# Patient Record
Sex: Male | Born: 1990 | Race: Black or African American | Hispanic: No | Marital: Married | Smoking: Current every day smoker
Health system: Southern US, Community
[De-identification: ages and names within clinical notes are randomized; demographics above are authoritative.]

## PROBLEM LIST (undated history)

## (undated) HISTORY — PX: OTHER SURGICAL HISTORY: SHX169

---

## 2016-08-05 ENCOUNTER — Encounter (HOSPITAL_COMMUNITY): Payer: Self-pay

## 2016-08-05 ENCOUNTER — Emergency Department (HOSPITAL_COMMUNITY): Payer: PRIVATE HEALTH INSURANCE

## 2016-08-05 ENCOUNTER — Emergency Department (HOSPITAL_COMMUNITY)
Admission: EM | Admit: 2016-08-05 | Discharge: 2016-08-05 | Disposition: A | Discharge status: Home / Self Care | Payer: PRIVATE HEALTH INSURANCE | Attending: Emergency Medicine | Admitting: Emergency Medicine

## 2016-08-05 DIAGNOSIS — Y92233 Cafeteria of hospital as the place of occurrence of the external cause: Secondary | ICD-10-CM | POA: Diagnosis not present

## 2016-08-05 DIAGNOSIS — F17200 Nicotine dependence, unspecified, uncomplicated: Secondary | ICD-10-CM | POA: Diagnosis not present

## 2016-08-05 DIAGNOSIS — S0103XA Puncture wound without foreign body of scalp, initial encounter: Secondary | ICD-10-CM | POA: Diagnosis not present

## 2016-08-05 DIAGNOSIS — Y9389 Activity, other specified: Secondary | ICD-10-CM | POA: Diagnosis not present

## 2016-08-05 DIAGNOSIS — S0990XA Unspecified injury of head, initial encounter: Secondary | ICD-10-CM

## 2016-08-05 DIAGNOSIS — W228XXA Striking against or struck by other objects, initial encounter: Secondary | ICD-10-CM | POA: Insufficient documentation

## 2016-08-05 DIAGNOSIS — Y990 Civilian activity done for income or pay: Secondary | ICD-10-CM | POA: Diagnosis not present

## 2016-08-05 DIAGNOSIS — S0101XA Laceration without foreign body of scalp, initial encounter: Secondary | ICD-10-CM

## 2016-08-05 NOTE — ED Provider Notes (Signed)
[facility]-EMERGENCY DEPT Provider Note   CSN: [id number] Arrival date & time: 08/05/16  4098     History   Chief Complaint Chief Complaint  Patient presents with  . Head Injury    HPI Corey Franco is a 25 y.o. male.  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 Patient struck his head on the cafeteria signed corner. About 20 minutes later he noticed that it was bleeding and when he saw the blood he got lightheaded he has some mild discomfort to the head at the site of the wound but no significant headache and patient did not pass out. Patient felt that maybe he would. Patient feels fine now. No neck pain. No other injuries. Tetanus is up-to-date.      History reviewed. No pertinent past medical history.  There are no active problems to display for this patient.   Past Surgical History:  Procedure Laterality Date  . thigh surgery         Home Medications    Prior to Admission medications   Not on File    Family History No family history on file.  Social History Social History  Substance Use Topics  . Smoking status: Current Every Day Smoker  . Smokeless tobacco: Never Used  . Alcohol use No     Allergies   Review of patient's allergies indicates no known allergies.   Review of Systems Review of Systems  Constitutional: Negative for fever.  HENT: Negative for congestion.   Eyes: Negative for visual disturbance.  Respiratory: Negative for shortness of breath.   Cardiovascular: Negative for chest pain.  Gastrointestinal: Negative for abdominal pain.  Musculoskeletal: Negative for neck pain.  Neurological: Positive for light-headedness and headaches.  Psychiatric/Behavioral: Negative for confusion.     Physical Exam Updated Vital Signs BP 155/87 (BP Location: Right Arm)   Pulse 68   Temp 97.5 F (36.4 C) (Oral)   Resp 18   Ht 6\' 6"  (1.981 m)   Wt 99.8 kg   SpO2 98%   BMI 25.42 kg/m   Physical Exam  Constitutional: He  is oriented to person, place, and time. He appears well-developed and well-nourished. No distress.  HENT:  Head: Normocephalic.  Mouth/Throat: Oropharynx is clear and moist.  A top of scalp with a small puncture wound measuring less than 5 mm. No active bleeding. No step off.  Eyes: Conjunctivae and EOM are normal. Pupils are equal, round, and reactive to light.  Neck: Normal range of motion. Neck supple.  Cardiovascular: Normal rate and regular rhythm.   Pulmonary/Chest: Effort normal. No respiratory distress.  Abdominal: Soft. Bowel sounds are normal. There is no tenderness.  Musculoskeletal: Normal range of motion. He exhibits no edema.  Neurological: He is alert and oriented to person, place, and time. No cranial nerve deficit. He exhibits normal muscle tone. Coordination normal.  Skin: Skin is warm.  Nursing note and vitals reviewed.    ED Treatments / Results  Labs (all labs ordered are listed, but only abnormal results are displayed) Labs Reviewed - No data to display  EKG  EKG Interpretation None       Radiology Ct Head Wo Contrast  Result Date: 08/05/2016 CLINICAL DATA:  Pain and dizziness after hitting head against solid object EXAM: CT HEAD WITHOUT CONTRAST TECHNIQUE: Contiguous axial images were obtained from the base of the skull through the vertex without intravenous contrast. COMPARISON:  None. FINDINGS: Brain: The ventricles are normal in size and configuration. There is  no intracranial mass, hemorrhage, extra-axial fluid collection, or midline shift. Gray-white compartments are normal. No acute infarct evident. Vascular: No hyperdense vessel.  No vascular calcification evident. Skull: The bony calvarium appears intact. Sinuses/Orbits: Visualized paranasal sinuses are clear. Visualized orbits appear symmetric bilaterally. Other: Visualized mastoid air cells are clear. IMPRESSION: Study within normal limits. Electronically Signed   By: Bretta BangWilliam  Woodruff III M.D.   On:  08/05/2016 10:39    Procedures Procedures (including critical care time)  Medications Ordered in ED Medications - No data to display   Initial Impression / Assessment and Plan / ED Course  I have reviewed the triage vital signs and the nursing notes.  Pertinent labs & imaging results that were available during my care of the patient were reviewed by me and considered in my medical decision making (see chart for details).  Clinical Course   Patient status post head injury in the cafeteria well at work. Resulting in a puncture wound to the scalp with slight bleeding. No suture stapling repair required. Tetanus up-to-date. Patient just with pain at around the wound site was started to bleed felt lightheaded. Head CT negative basic wound care work note provided for today. Patient will return for any new or worse symptoms. Did discuss the possibility of mild concussive symptoms with the patient.  Final Clinical Impressions(s) / ED Diagnoses   Final diagnoses:  Head injury, initial encounter  Scalp laceration, initial encounter    New Prescriptions New Prescriptions   No medications on file     Vanetta MuldersScott Lucianna Ostlund, MD 08/05/16 1122

## 2016-08-05 NOTE — Discharge Instructions (Signed)
CT of the head was negative for any bony or brain injury. Work note provided. Return for any new or worse symptoms.

## 2016-08-05 NOTE — ED Notes (Signed)
Bed: WA02 Expected date:  Expected time:  Means of arrival:  Comments: Employee/head injury

## 2016-08-05 NOTE — ED Triage Notes (Signed)
Pt presents with c/o head injury. Pt was at work to pick up a patient in [employer] and as he turned around to pick up his food, being 6'6", he hit his head on the cafeteria sign. Pt reports he is feeling some dizziness at this time. Pt reports no LOC after the incident. Alert and oriented at this time and able to answer questions appropriately.

## 2017-08-18 ENCOUNTER — Inpatient Hospital Stay: Payer: PRIVATE HEALTH INSURANCE

## 2017-08-30 ENCOUNTER — Ambulatory Visit: Payer: Self-pay | Attending: Internal Medicine | Admitting: Physician Assistant

## 2017-08-30 ENCOUNTER — Encounter: Payer: Self-pay | Admitting: Physician Assistant

## 2017-09-06 ENCOUNTER — Ambulatory Visit: Payer: Self-pay | Attending: Nurse Practitioner | Admitting: Nurse Practitioner

## 2017-09-06 ENCOUNTER — Encounter: Payer: Self-pay | Admitting: Nurse Practitioner

## 2017-09-06 VITALS — BP 125/74 | HR 78 | Temp 97.4°F | Ht 78.0 in | Wt 202.4 lb

## 2017-09-06 DIAGNOSIS — F17210 Nicotine dependence, cigarettes, uncomplicated: Secondary | ICD-10-CM | POA: Insufficient documentation

## 2017-09-06 DIAGNOSIS — Z716 Tobacco abuse counseling: Secondary | ICD-10-CM | POA: Insufficient documentation

## 2017-09-06 DIAGNOSIS — L989 Disorder of the skin and subcutaneous tissue, unspecified: Secondary | ICD-10-CM | POA: Insufficient documentation

## 2017-09-06 DIAGNOSIS — Z833 Family history of diabetes mellitus: Secondary | ICD-10-CM | POA: Insufficient documentation

## 2017-09-06 NOTE — Progress Notes (Signed)
Pt is here today for bump on finger and feet.

## 2017-09-06 NOTE — Progress Notes (Signed)
CC:  HPI: Corey Franco is a 26 y.o. male here today to establish care.  He denies any PMH however is concerned about a possible lesion on his right index finger.   Warts Patient complains of a possible wart. The wart is located on the 2nd finger(s) right hand. It has been present for several weeks. He denies pain or cellulitic infection symptoms.  Smoking Cessation Endorses being a smoker for the past 10 years. The longest time he has refrained from cigarettes was 6 days. He is aware of the complications from smoking including stroke and cancer. States "I need to stop".      HEALTH MAINTENANCE Influenza: Patient declines. Reports he has already received the vaccination through his employer.  Tdap:  Patient declines. Reports he has already received the vaccination through his employer.    ALLERGIES: No Known Allergies  PAST MEDICAL HISTORY: History reviewed. No pertinent past medical history.  SOCIAL HISTORY Social History   Social History  . Marital status: Married    Spouse name: N/A  . Number of children: N/A  . Years of education: N/A   Occupational History  . Not on file.   Social History Main Topics  . Smoking status: Current Every Day Smoker  . Smokeless tobacco: Never Used  . Alcohol use No  . Drug use: No  . Sexual activity: Not on file   Other Topics Concern  . Not on file   Social History Narrative  . No narrative on file    FAMILY HISTORY History reviewed. No pertinent family history.   MEDICATIONS AT HOME: Prior to Admission medications   Not on File     Review of Systems  Constitutional: Negative for fever, malaise/fatigue and weight loss.  HENT: Negative.   Eyes: Negative.   Respiratory: Negative.  Negative for cough, shortness of breath and wheezing.   Cardiovascular: Negative.  Negative for chest pain, palpitations and PND.  Gastrointestinal: Positive for constipation. Negative for abdominal pain, diarrhea, heartburn, melena,  nausea and vomiting.  Genitourinary: Negative.   Musculoskeletal: Negative.   Skin: Negative for itching and rash.       Wart on right index finger  Neurological: Negative.  Negative for dizziness, tingling, tremors, seizures and headaches.  Psychiatric/Behavioral: Negative.     Objective:   Vitals:   09/06/17 0946  BP: 125/74  Pulse: 78  Temp: (!) 97.4 F (36.3 C)  SpO2: 100%    Physical Exam  Constitutional: He is oriented to person, place, and time and well-developed, well-nourished, and in no distress.  HENT:  Head: Normocephalic and atraumatic.  Eyes: EOM are normal.  Neck: Normal range of motion.  Cardiovascular: Normal rate, regular rhythm and normal heart sounds.   Pulmonary/Chest: Effort normal and breath sounds normal. No respiratory distress. He has no wheezes. He has no rales. He exhibits no tenderness.  Abdominal: Soft. Bowel sounds are normal. He exhibits no distension and no mass. There is no tenderness. There is no rebound and no guarding.  Musculoskeletal: Normal range of motion.       Right hand: He exhibits normal range of motion, no laceration and no swelling.       Hands: Neurological: He is alert and oriented to person, place, and time. Gait normal.  Skin: Skin is warm and dry.  Psychiatric: Mood, memory, affect and judgment normal.        Assessment and plan:     Corey Franco was seen today for establish care.  Diagnoses and all  orders for this visit:  Finger lesion -     Ambulatory referral to Dermatology  Family history of diabetes mellitus -     Lipid panel -     CBC -     Basic Metabolic Panel  Encounter for smoking cessation counseling Corey Franco was counseled on the dangers of tobacco use, and was advised to quit. Reviewed strategies to maximize success, including removing cigarettes and smoking materials from environment, stress management and support of family/friends as well as pharmacological alternatives including: Wellbutrin, Chantix,  Nicotine patch, Nicotine gum or lozenges. Smoking cessation support: smoking cessation hotline: 1-800-QUIT-NOW.  Smoking cessation classes are also available through [facility]. Call [phone] or visit our website at [email or web address].   Spent 5 minutes counseling on smoking cessation and patient is not ready to quit.  Patient has been counseled extensively about nutrition and exercise. Other issues discussed during this visit include: low cholesterol diet, weight control with exercise at least 150 minutes per week, foot care, annual eye examinations at Ophthalmology, importance of adherence with medications and regular follow-up.    Return in about 4 weeks (around 10/04/2017), for Follow back up for physical and labs in 6-8weeks . Needs to see Diane for financial assistance ,   The patient was given clear instructions to go to ER or return to medical center if symptoms don't improve, worsen or new problems develop. The patient verbalized understanding.    Claiborne RiggZelda W Yazir Koerber, FNP-BC, [facility] [place], [place] [phone]

## 2017-09-06 NOTE — Patient Instructions (Addendum)
Preventive Care 28-39 Years, Male Preventive care refers to lifestyle choices and visits with your health care provider that can promote health and wellness. What does preventive care include?  A yearly physical exam. This is also called an annual well check.  Dental exams once or twice a year.  Routine eye exams. Ask your health care provider how often you should have your eyes checked.  Personal lifestyle choices, including: ? Daily care of your teeth and gums. ? Regular physical activity. ? Eating a healthy diet. ? Avoiding tobacco and drug use. ? Limiting alcohol use. ? Practicing safe sex. What happens during an annual well check? The services and screenings done by your health care provider during your annual well check will depend on your age, overall health, lifestyle risk factors, and family history of disease. Counseling Your health care provider may ask you questions about your:  Alcohol use.  Tobacco use.  Drug use.  Emotional well-being.  Home and relationship well-being.  Sexual activity.  Eating habits.  Work and work environment.  Screening You may have the following tests or measurements:  Height, weight, and BMI.  Blood pressure.  Lipid and cholesterol levels. These may be checked every 5 years starting at age 20.  Diabetes screening. This is done by checking your blood sugar (glucose) after you have not eaten for a while (fasting).  Skin check.  Hepatitis C blood test.  Hepatitis B blood test.  Sexually transmitted disease (STD) testing.  Discuss your test results, treatment options, and if necessary, the need for more tests with your health care provider. Vaccines Your health care provider may recommend certain vaccines, such as:  Influenza vaccine. This is recommended every year.  Tetanus, diphtheria, and acellular pertussis (Tdap, Td) vaccine. You may need a Td booster every 10 years.  Varicella vaccine. You may need this if you  have not been vaccinated.  HPV vaccine. If you are 26 or younger, you may need three doses over 6 months.  Measles, mumps, and rubella (MMR) vaccine. You may need at least one dose of MMR.You may also need a second dose.  Pneumococcal 13-valent conjugate (PCV13) vaccine. You may need this if you have certain conditions and have not been vaccinated.  Pneumococcal polysaccharide (PPSV23) vaccine. You may need one or two doses if you smoke cigarettes or if you have certain conditions.  Meningococcal vaccine. One dose is recommended if you are age 28-21 years and a first-year college student living in a residence hall, or if you have one of several medical conditions. You may also need additional booster doses.  Hepatitis A vaccine. You may need this if you have certain conditions or if you travel or work in places where you may be exposed to hepatitis A.  Hepatitis B vaccine. You may need this if you have certain conditions or if you travel or work in places where you may be exposed to hepatitis B.  Haemophilus influenzae type b (Hib) vaccine. You may need this if you have certain risk factors.  Talk to your health care provider about which screenings and vaccines you need and how often you need them. This information is not intended to replace advice given to you by your health care provider. Make sure you discuss any questions you have with your health care provider. Document Released: 12/20/2001 Document Revised: 07/13/2016 Document Reviewed: 08/25/2015 Elsevier Interactive Patient Education  2017 Elsevier Inc.  

## 2017-09-07 LAB — BASIC METABOLIC PANEL
BUN / CREAT RATIO: 16 (ref 9–20)
BUN: 11 mg/dL (ref 6–20)
CHLORIDE: 101 mmol/L (ref 96–106)
CO2: 27 mmol/L (ref 20–29)
Calcium: 9.6 mg/dL (ref 8.7–10.2)
Creatinine, Ser: 0.7 mg/dL — ABNORMAL LOW (ref 0.76–1.27)
GFR calc Af Amer: 148 mL/min/{1.73_m2} (ref >59)
GFR calc non Af Amer: 128 mL/min/{1.73_m2} (ref >59)
GLUCOSE: 96 mg/dL (ref 65–99)
Potassium: 4.4 mmol/L (ref 3.5–5.2)
SODIUM: 143 mmol/L (ref 134–144)

## 2017-09-07 LAB — LIPID PANEL
CHOL/HDL RATIO: 4.1 ratio (ref 0.0–5.0)
Cholesterol, Total: 160 mg/dL (ref 100–199)
HDL: 39 mg/dL — ABNORMAL LOW (ref >39)
LDL CALC: 98 mg/dL (ref 0–99)
Triglycerides: 116 mg/dL (ref 0–149)
VLDL CHOLESTEROL CAL: 23 mg/dL (ref 5–40)

## 2017-09-07 LAB — CBC
Hematocrit: 46.4 % (ref 37.5–51.0)
Hemoglobin: 15.6 g/dL (ref 13.0–17.7)
MCH: 29.8 pg (ref 26.6–33.0)
MCHC: 33.6 g/dL (ref 31.5–35.7)
MCV: 89 fL (ref 79–97)
PLATELETS: 269 10*3/uL (ref 150–379)
RBC: 5.23 x10E6/uL (ref 4.14–5.80)
RDW: 12.7 % (ref 12.3–15.4)
WBC: 7.3 10*3/uL (ref 3.4–10.8)

## 2017-09-11 ENCOUNTER — Ambulatory Visit: Payer: Self-pay | Attending: Nurse Practitioner

## 2017-10-25 ENCOUNTER — Encounter: Payer: Self-pay | Admitting: Nurse Practitioner

## 2018-03-06 ENCOUNTER — Emergency Department (HOSPITAL_BASED_OUTPATIENT_CLINIC_OR_DEPARTMENT_OTHER)
Admission: EM | Admit: 2018-03-06 | Discharge: 2018-03-06 | Disposition: A | Discharge status: Home / Self Care | Payer: Self-pay | Attending: Emergency Medicine | Admitting: Emergency Medicine

## 2018-03-06 ENCOUNTER — Encounter (HOSPITAL_BASED_OUTPATIENT_CLINIC_OR_DEPARTMENT_OTHER): Payer: Self-pay

## 2018-03-06 ENCOUNTER — Other Ambulatory Visit: Payer: Self-pay

## 2018-03-06 DIAGNOSIS — H6121 Impacted cerumen, right ear: Secondary | ICD-10-CM | POA: Insufficient documentation

## 2018-03-06 DIAGNOSIS — F17210 Nicotine dependence, cigarettes, uncomplicated: Secondary | ICD-10-CM | POA: Insufficient documentation

## 2018-03-06 MED ORDER — IBUPROFEN 800 MG PO TABS
800.0000 mg | ORAL_TABLET | Freq: Once | ORAL | Status: AC
  Administered 2018-03-06: 800 mg via ORAL
  Filled 2018-03-06: qty 1

## 2018-03-06 MED ORDER — DOCUSATE SODIUM 50 MG/5ML PO LIQD
50.0000 mg | Freq: Once | ORAL | Status: AC
  Administered 2018-03-06: 50 mg via OTIC

## 2018-03-06 MED ORDER — NEOMYCIN-POLYMYXIN-HC 3.5-10000-1 OT SUSP: 3.0000 [drp] | Freq: Four times a day (QID) | OTIC | 0 refills | Status: AC

## 2018-03-06 MED ORDER — DOCUSATE SODIUM 50 MG/5ML PO LIQD
ORAL | Status: AC
  Administered 2018-03-06: 50 mg via OTIC
  Filled 2018-03-06: qty 10

## 2018-03-06 MED ORDER — DOCUSATE SODIUM 100 MG PO CAPS
ORAL_CAPSULE | ORAL | Status: AC
  Filled 2018-03-06: qty 1

## 2018-03-06 NOTE — ED Notes (Signed)
Right irrigated with warm water by EMT, Gaspar Garbe. Moderated amount of was removed, wax still visualized against ear drum per MD.

## 2018-03-06 NOTE — ED Triage Notes (Signed)
C/o hearing a static sound and numbness right side of face when he woke at 1am-denies injury-NAD-steady gait

## 2018-03-06 NOTE — ED Provider Notes (Addendum)
[facility] EMERGENCY DEPARTMENT Provider Note   CSN: [id number] Arrival date & time: 03/06/18  0026     History   Chief Complaint Chief Complaint  Patient presents with  . Hearing Problem    HPI Corey Franco is a 27 y.o. male.  The history is provided by the patient.  Otalgia  This is a new problem. The current episode started yesterday. There is pain in the right ear. The problem occurs constantly. The problem has been rapidly worsening. There has been no fever. The pain is moderate. Associated symptoms include hearing loss. Pertinent negatives include no headaches and no neck pain. Associated symptoms comments: Funny sound in right ear. His past medical history does not include tympanostomy tube.    History reviewed. No pertinent past medical history.  There are no active problems to display for this patient.   Past Surgical History:  Procedure Laterality Date  . thigh surgery          Home Medications    Prior to Admission medications   Medication Sig Start Date End Date Taking? Authorizing Provider  neomycin-polymyxin-hydrocortisone (CORTISPORIN) 3.5-10000-1 OTIC suspension Place 3 drops into the right ear 4 (four) times daily. X 7 days 03/06/18   Cy Blamer, MD    Family History No family history on file.  Social History Social History   Tobacco Use  . Smoking status: Current Every Day Smoker    Types: Cigarettes  . Smokeless tobacco: Never Used  Substance Use Topics  . Alcohol use: Yes    Comment: occ  . Drug use: No     Allergies   Patient has no known allergies.   Review of Systems Review of Systems  HENT: Positive for ear pain and hearing loss.   Respiratory: Negative for shortness of breath.   Cardiovascular: Negative for chest pain.  Musculoskeletal: Negative for neck pain.  Neurological: Negative for dizziness, tremors, seizures, syncope, facial asymmetry, speech difficulty, weakness, light-headedness, numbness and  headaches.  All other systems reviewed and are negative.    Physical Exam Updated Vital Signs BP (!) 148/104 (BP Location: Left Arm)   Pulse 78   Temp 98.2 F (36.8 C) (Oral)   Resp 18   Ht  (1.981 m)   Wt 94.9 kg (209 lb 3.5 oz)   SpO2 100%   BMI 24.18 kg/m   Physical Exam  Constitutional: He is oriented to person, place, and time. He appears well-developed and well-nourished.  HENT:  Head: Normocephalic and atraumatic.  Left Ear: No mastoid tenderness. Tympanic membrane is not injected, not perforated, not erythematous and not bulging.  No middle ear effusion. No hemotympanum.  Mouth/Throat: No oropharyngeal exudate.  Initially entire canal on right is full of inspisated cerumen  Post dissimpaction- TM R is scarred and canal is pink and inflamed.   Eyes: EOM are normal.  Neck: Normal range of motion. Neck supple.  Cardiovascular: Normal rate, regular rhythm, normal heart sounds and intact distal pulses.  Pulmonary/Chest: Effort normal and breath sounds normal. No stridor.  Abdominal: Soft. Bowel sounds are normal. There is no tenderness.  Musculoskeletal: Normal range of motion.  Neurological: He is alert and oriented to person, place, and time. He displays normal reflexes.  Skin: Skin is warm and dry. Capillary refill takes less than 2 seconds.     ED Treatments / Results    Procedures Procedures (including critical care time)  Medications Ordered in ED Medications  docusate sodium (COLACE) 100 MG capsule (has no  administration in time range)  ibuprofen (ADVIL,MOTRIN) tablet 800 mg (800 mg Oral Given 03/06/18 0311)  docusate (COLACE) 50 MG/5ML liquid 50 mg (50 mg Right EAR Given 03/06/18 0220)    Irrigation of the right ear done by Gaspar Garbe, tech     Final Clinical Impressions(s) / ED Diagnoses   Final diagnoses:  Impacted cerumen of right ear   Do not put anything in the ear unless directed by a doctor.  Follow up with ENT for ongoing care.    Return  for weakness, numbness, changes in vision or speech, fevers >100.4 unrelieved by medication, shortness of breath, intractable vomiting, or diarrhea, abdominal pain, Inability to tolerate liquids or food, cough, altered mental status or any concerns. No signs of systemic illness or infection. The patient is nontoxic-appearing on exam and vital signs are within normal limits.   I have reviewed the triage vital signs and the nursing notes. Pertinent labs &imaging results that were available during my care of the patient were reviewed by me and considered in my medical decision making (see chart for details).  After history, exam, and medical workup I feel the patient has been appropriately medically screened and is safe for discharge home. Pertinent diagnoses were discussed with the patient. Patient was given return precautions. ED Discharge Orders        Ordered    neomycin-polymyxin-hydrocortisone (CORTISPORIN) 3.5-10000-1 OTIC suspension  4 times daily     03/06/18 0309       Jalayah Gutridge, MD 03/06/18 0403    Sonjia Wilcoxson, MD 03/06/18 1610

## 2018-03-12 ENCOUNTER — Emergency Department (HOSPITAL_COMMUNITY): Payer: Self-pay

## 2018-03-12 ENCOUNTER — Encounter (HOSPITAL_COMMUNITY): Payer: Self-pay | Admitting: *Deleted

## 2018-03-12 ENCOUNTER — Emergency Department (HOSPITAL_COMMUNITY)
Admission: EM | Admit: 2018-03-12 | Discharge: 2018-03-12 | Disposition: A | Discharge status: Home / Self Care | Payer: Self-pay | Attending: Emergency Medicine | Admitting: Emergency Medicine

## 2018-03-12 DIAGNOSIS — S022XXA Fracture of nasal bones, initial encounter for closed fracture: Secondary | ICD-10-CM | POA: Insufficient documentation

## 2018-03-12 DIAGNOSIS — F17210 Nicotine dependence, cigarettes, uncomplicated: Secondary | ICD-10-CM | POA: Insufficient documentation

## 2018-03-12 DIAGNOSIS — Y92009 Unspecified place in unspecified non-institutional (private) residence as the place of occurrence of the external cause: Secondary | ICD-10-CM | POA: Insufficient documentation

## 2018-03-12 DIAGNOSIS — Y9301 Activity, walking, marching and hiking: Secondary | ICD-10-CM | POA: Insufficient documentation

## 2018-03-12 DIAGNOSIS — S022XXB Fracture of nasal bones, initial encounter for open fracture: Secondary | ICD-10-CM

## 2018-03-12 DIAGNOSIS — Z79899 Other long term (current) drug therapy: Secondary | ICD-10-CM | POA: Insufficient documentation

## 2018-03-12 DIAGNOSIS — Y999 Unspecified external cause status: Secondary | ICD-10-CM | POA: Insufficient documentation

## 2018-03-12 DIAGNOSIS — W0110XA Fall on same level from slipping, tripping and stumbling with subsequent striking against unspecified object, initial encounter: Secondary | ICD-10-CM | POA: Insufficient documentation

## 2018-03-12 MED ORDER — HYDROCODONE-ACETAMINOPHEN 5-325 MG PO TABS: 1.0000 | ORAL_TABLET | Freq: Four times a day (QID) | ORAL | 0 refills | Status: AC | PRN

## 2018-03-12 MED ORDER — CEPHALEXIN 500 MG PO CAPS: 500.0000 mg | ORAL_CAPSULE | Freq: Three times a day (TID) | ORAL | 0 refills | Status: AC

## 2018-03-12 MED ORDER — CEPHALEXIN 500 MG PO CAPS: 500.0000 mg | ORAL_CAPSULE | Freq: Three times a day (TID) | ORAL | 0 refills | Status: DC

## 2018-03-12 MED ORDER — HYDROCODONE-ACETAMINOPHEN 5-325 MG PO TABS: 1.0000 | ORAL_TABLET | Freq: Four times a day (QID) | ORAL | 0 refills | Status: DC | PRN

## 2018-03-12 MED ORDER — CEPHALEXIN 500 MG PO CAPS
500.0000 mg | ORAL_CAPSULE | Freq: Once | ORAL | Status: AC
  Administered 2018-03-12: 500 mg via ORAL
  Filled 2018-03-12: qty 1

## 2018-03-12 MED ORDER — BACITRACIN ZINC 500 UNIT/GM EX OINT
TOPICAL_OINTMENT | Freq: Once | CUTANEOUS | Status: AC
  Administered 2018-03-12: 1 via TOPICAL
  Filled 2018-03-12: qty 0.9

## 2018-03-12 NOTE — ED Provider Notes (Signed)
[facility]-EMERGENCY DEPT Provider Note   CSN: [id number] Arrival date & time: 03/12/18  4098     History   Chief Complaint Chief Complaint  Patient presents with  . Fall  . Facial Pain    HPI Corey Franco is a 27 y.o. male.  HPI  27 year old male with no significant past medical history here with fall and facial pain.  The patient states he was taking out his trash at around 1 AM yesterday.  He fell forward after tripping on something on the ground.  He was wearing flip-flops.  He states he struck his face on the trashcan.  No loss of consciousness.  He experienced immediate onset of a nosebleed and nasal pain.  He also had some superficial abrasions on his lips.  He states he went back to bed.  His bleeding was controlled within 10 minutes.  He presents today because he has nasal congestion and can feel like his nose is broken.  Denies any vision changes.  Denies any dental pain or loose teeth.  He has had no further bleeding.  Denies any headache.  No neck pain.  He is not on blood thinners.  Pain is worse with palpation.  No alleviating factors.  History reviewed. No pertinent past medical history.  There are no active problems to display for this patient.   Past Surgical History:  Procedure Laterality Date  . thigh surgery          Home Medications    Prior to Admission medications   Medication Sig Start Date End Date Taking? Authorizing Provider  ibuprofen (ADVIL,MOTRIN) 800 MG tablet Take 800 mg by mouth daily as needed for moderate pain.   Yes [provider]  Multiple Vitamin (MULTIVITAMIN WITH MINERALS) TABS tablet Take 1 tablet by mouth daily.   Yes [provider]  neomycin-polymyxin-hydrocortisone (CORTISPORIN) 3.5-10000-1 OTIC suspension Place 3 drops into the right ear 4 (four) times daily. X 7 days 03/06/18  Yes Palumbo, April, MD  cephALEXin (KEFLEX) 500 MG capsule Take 1 capsule (500 mg total) by mouth 3 (three)  times daily for 7 days. 03/12/18 03/19/18  Shaune Pollack, MD  HYDROcodone-acetaminophen (NORCO/VICODIN) 5-325 MG tablet Take 1-2 tablets by mouth every 6 (six) hours as needed for severe pain. 03/12/18   Shaune Pollack, MD    Family History No family history on file.  Social History Social History   Tobacco Use  . Smoking status: Current Every Day Smoker    Types: Cigarettes  . Smokeless tobacco: Never Used  Substance Use Topics  . Alcohol use: Yes    Comment: occ  . Drug use: No     Allergies   Patient has no known allergies.   Review of Systems Review of Systems  Constitutional: Negative for chills, fatigue and fever.  HENT: Positive for congestion, facial swelling and nosebleeds. Negative for rhinorrhea.   Eyes: Negative for visual disturbance.  Respiratory: Negative for cough, shortness of breath and wheezing.   Cardiovascular: Negative for chest pain and leg swelling.  Gastrointestinal: Negative for abdominal pain, diarrhea, nausea and vomiting.  Genitourinary: Negative for dysuria and flank pain.  Musculoskeletal: Negative for neck pain and neck stiffness.  Skin: Negative for rash and wound.  Allergic/Immunologic: Negative for immunocompromised state.  Neurological: Negative for syncope, weakness and headaches.  All other systems reviewed and are negative.    Physical Exam Updated Vital Signs BP 136/90   Pulse 64   Temp 98 F (36.7 C) (Oral)  Resp 16   Ht  (1.981 m)   Wt 94.8 kg (209 lb)   SpO2 100%   BMI 24.15 kg/m   Physical Exam  Constitutional: He is oriented to person, place, and time. He appears well-developed and well-nourished. No distress.  HENT:  Head: Normocephalic.  Superficial abrasions to the left maxillary cheek.  Superficial, less than 1 mm lacerations on the lip mucosa of the lower and upper lip, with no gaping wounds.  No apparent dental trauma or blood in the oropharynx.  Nasal exam shows palpable crepitance along the right upper  nasal bridge.  There is no nasal septal hematoma or deformity.  Nasopharynx is widely patent.  Phonation normal.  Eyes: Conjunctivae are normal.  Neck: Neck supple.  Cardiovascular: Normal rate, regular rhythm and normal heart sounds. Exam reveals no friction rub.  No murmur heard. Pulmonary/Chest: Effort normal and breath sounds normal. No respiratory distress. He has no wheezes. He has no rales.  Abdominal: He exhibits no distension.  Musculoskeletal: He exhibits no edema.  Neurological: He is alert and oriented to person, place, and time. He exhibits normal muscle tone.  Skin: Skin is warm. Capillary refill takes less than 2 seconds.  Psychiatric: He has a normal mood and affect.  Nursing note and vitals reviewed.    ED Treatments / Results  Labs (all labs ordered are listed, but only abnormal results are displayed) Labs Reviewed - No data to display  EKG None  Radiology Ct Maxillofacial Wo Contrast  Result Date: 03/12/2018 CLINICAL DATA:  Fall at 1 a.m.  Laceration to the nose.  Pain. EXAM: CT MAXILLOFACIAL WITHOUT CONTRAST TECHNIQUE: Multidetector CT imaging of the maxillofacial structures was performed. Multiplanar CT image reconstructions were also generated. COMPARISON:  CT head without contrast 08/05/2016. FINDINGS: Osseous: Bilateral nasal bone fractures are present. There is involvement of the frontal process of the maxilla on the left. There is slight deviation to the right. Osseous nasal septal fracture is noted as well. The sinuses are intact. There are no fractures through the sinuses. Zygomatic arch is normal bilaterally. The mandible is intact and located. Orbits: Globes and orbits are within normal limits. Sinuses: Paranasal sinuses are clear. There is no fluid suggest occult fracture. Soft tissues: Subcutaneous gas spreads over the right maxilla without associated fracture. This is likely related to the nasal bone fractures. Soft tissue swelling is present about the nose.  Additional soft tissue swelling is present bilaterally. Perioral and lip swelling is present on the left. No underlying fracture or foreign body is present. Limited intracranial: Within normal limits. IMPRESSION: 1. Comminuted nasal bone fractures bilaterally are slightly displaced to the right. 2. Anterior nasal septal fracture present. 3. No direct sinus fracture. 4. Diffuse soft tissue swelling over the face as described. Electronically Signed   By: Marin Roberts M.D.   On: 03/12/2018 08:28    Procedures Procedures (including critical care time)  Medications Ordered in ED Medications  bacitracin ointment (1 application Topical Given 03/12/18 0810)  cephALEXin (KEFLEX) capsule 500 mg (500 mg Oral Given 03/12/18 0919)     Initial Impression / Assessment and Plan / ED Course  I have reviewed the triage vital signs and the nursing notes.  Pertinent labs & imaging results that were available during my care of the patient were reviewed by me and considered in my medical decision making (see chart for details).    27 year old male here with nasal pain after fall and facial injury.  No loss of consciousness.  He is not on blood thinners.  On exam, I suspect nasal bridge fracture given crepitance.  No apparent visual changes, or signs of entrapment or significant orbital injury.  Given his crepitance on exam, will obtain CT face.  He denies any headache, loss of consciousness, and would like to prevent additional radiation.  I do not feel CT head indicated given absence of any other red flag symptoms.  No evidence of basilar skull fracture.  No hemotympanum.  CT shows comminuted nasal fracture.  No hematoma.  He appears midline clinically.  Discussed with Dr. Jearld Fenton.  Will place on prophylactic antibiotics and follow-up in clinic in 5 days.  No nose blowing or swimming.  Return precautions given.  Final Clinical Impressions(s) / ED Diagnoses   Final diagnoses:  Open fracture of nasal bone,  initial encounter    ED Discharge Orders        Ordered    cephALEXin (KEFLEX) 500 MG capsule  3 times daily     03/12/18 0941    HYDROcodone-acetaminophen (NORCO/VICODIN) 5-325 MG tablet  Every 6 hours PRN     03/12/18 0941       Shaune Pollack, MD 03/12/18 0945

## 2018-03-12 NOTE — Discharge Instructions (Addendum)
For your nose/nasal fracture: - Do not BLOW your nose - Do not place anything into your nose, such as tissues - Do NOT swim or submerge your head under water - Eat soft foods - Take the antibiotics and pain meds as prescribed  Return to ER if you have uncontrolled bleeding, headaches, vision changes, difficulty breathing.

## 2018-03-12 NOTE — ED Triage Notes (Signed)
Pt stated "got up to take out the trash, tripped and hit face on dumpster, then hit the ground."  Pt presents with lacs to lower lip, abrasion to left cheek & nose.  Pt stated "my nose was bleeding & it feels broken."

## 2018-03-12 NOTE — ED Notes (Addendum)
Pt also presents with lac to left upper lip.  Pt denies LOC.

## 2019-10-03 IMAGING — CT CT MAXILLOFACIAL W/O CM
3 series · 15 of 47 positions shown, 18 images · non-contrast
Comparison: CT head without contrast 08/05/2016.

CLINICAL DATA: Fall at 1 a.m..  Laceration to the nose.  Pain.

EXAM:
CT MAXILLOFACIAL WITHOUT CONTRAST
TECHNIQUE: Multidetector CT imaging of the maxillofacial structures was
performed. Multiplanar CT image reconstructions were also generated.

[Series 3: max soft · axial · 0.33mm/px · z∈[-227,-69]mm · 9 of 93 slices shown, 12 images]
[im 7/93  brain]
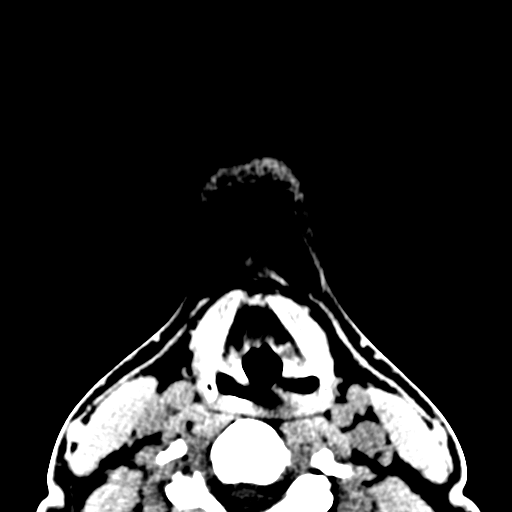
[im 7/93  bone]
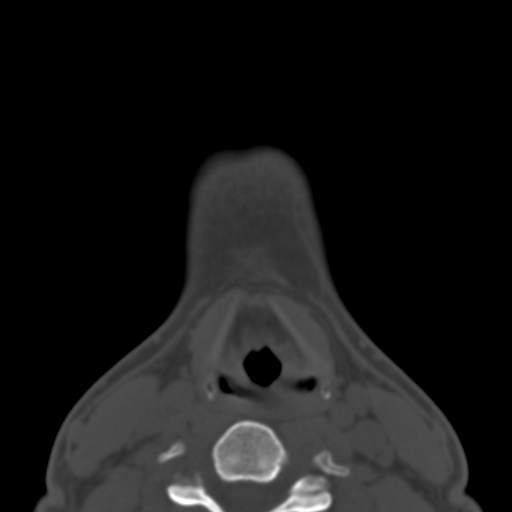
[im 16/93  bone]
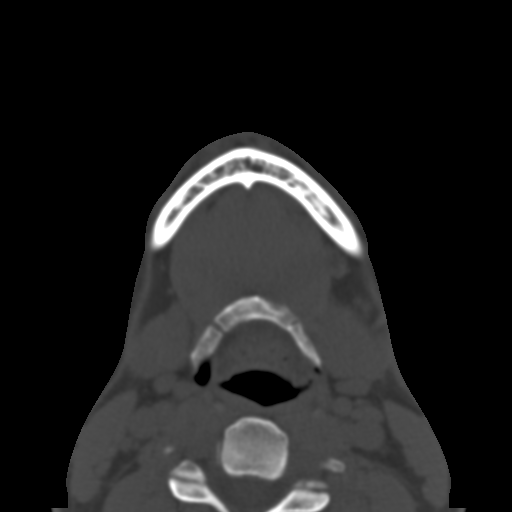
[im 26/93  bone]
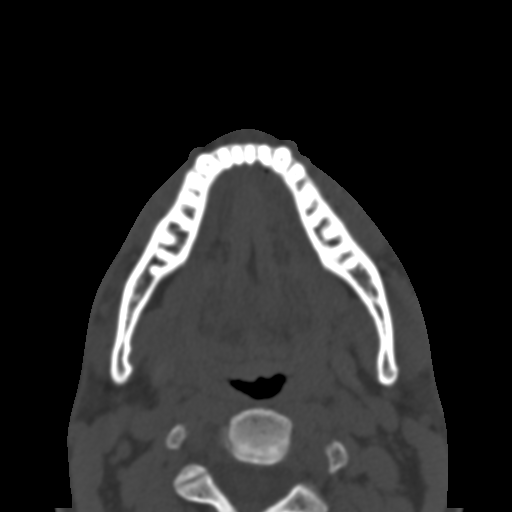
[im 35/93  bone]
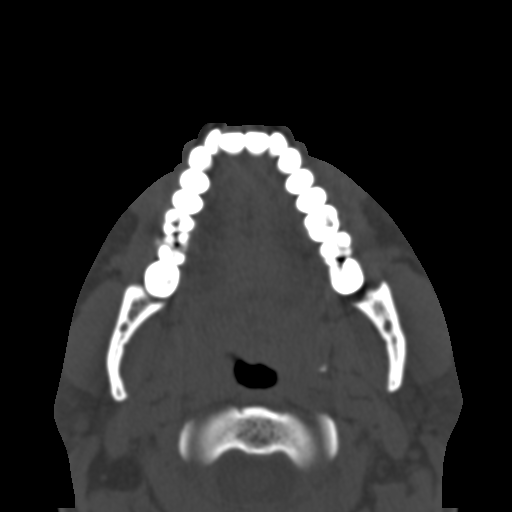
[im 48/93  brain]
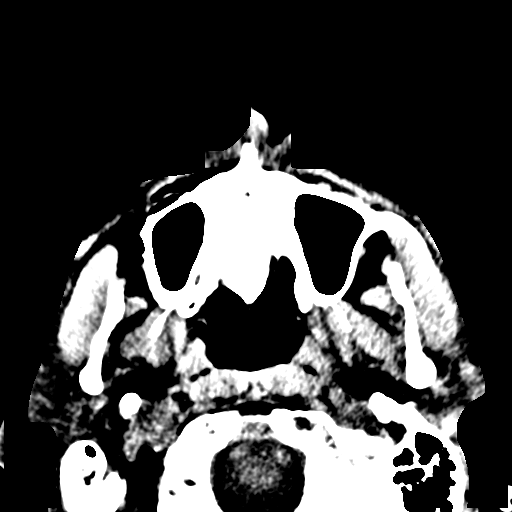
[im 48/93  bone]
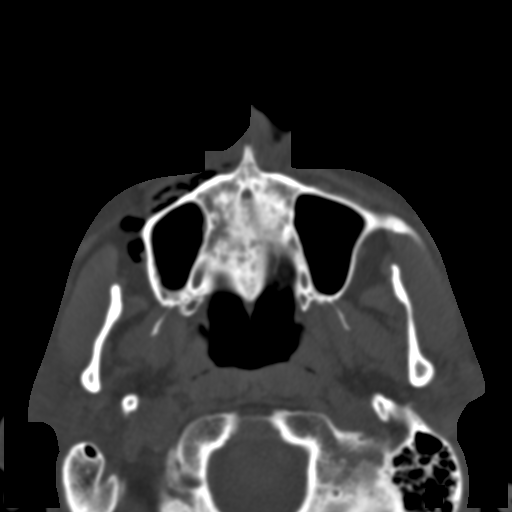
[im 58/93  bone]
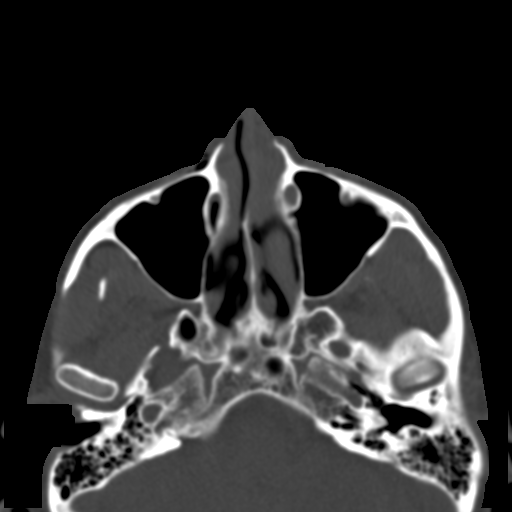
[im 67/93  bone]
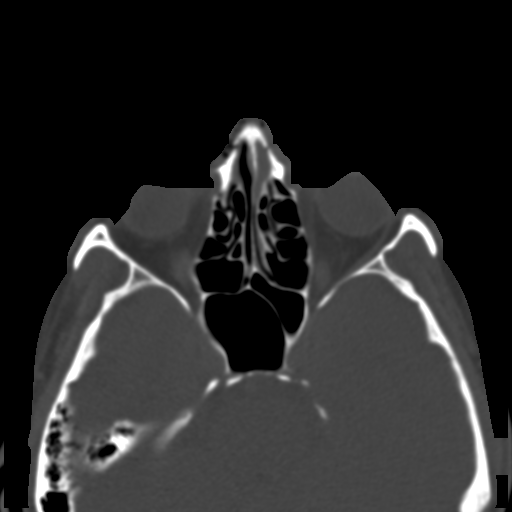
[im 77/93  bone]
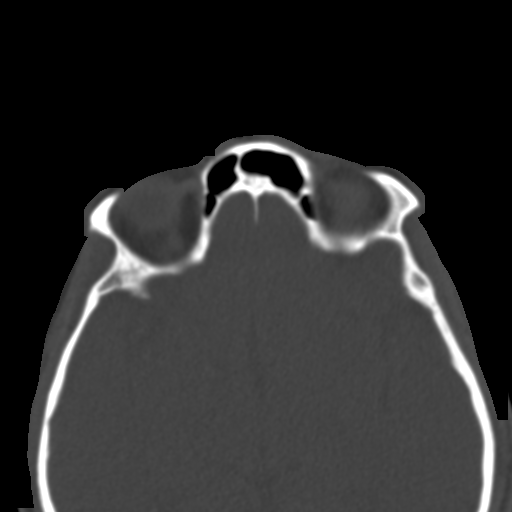
[im 86/93  brain]
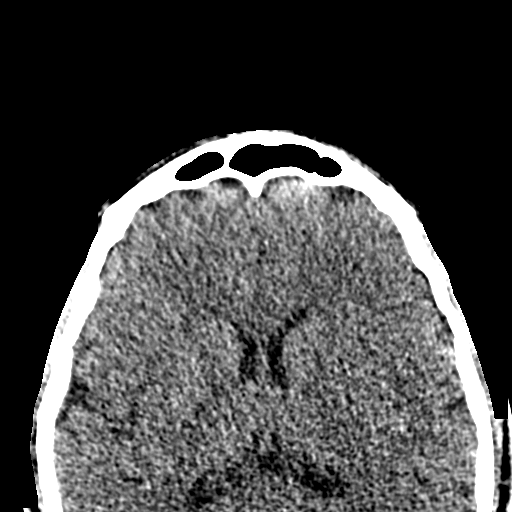
[im 86/93  bone]
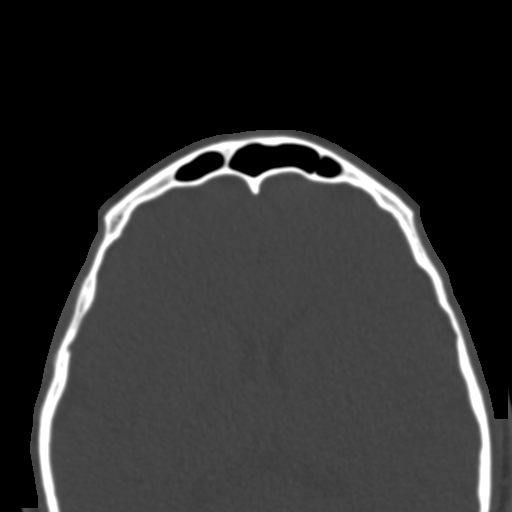

[Series 7: coronal soft · coronal · 0.39mm/px · 3 of 76 slices shown]
[im 26/76  bone]
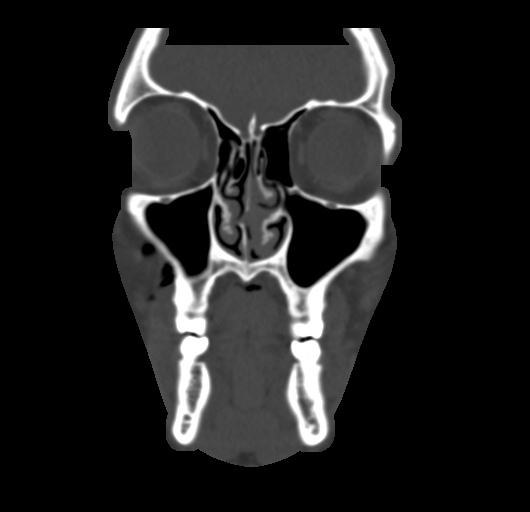
[im 34/76  bone]
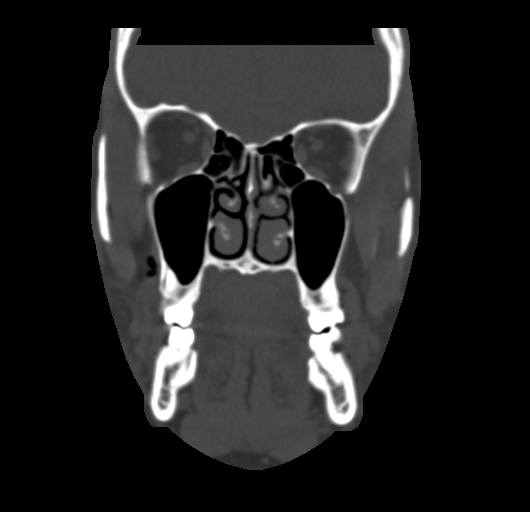
[im 42/76  bone]
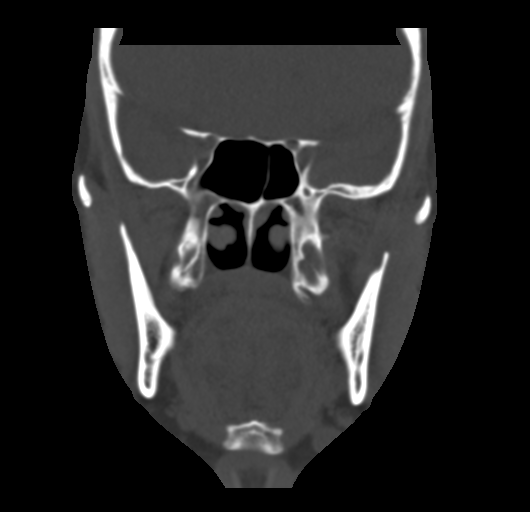

[Series 8: sagittal soft · sagittal · 0.37mm/px · 3 of 96 slices shown]
[im 32/96  bone]
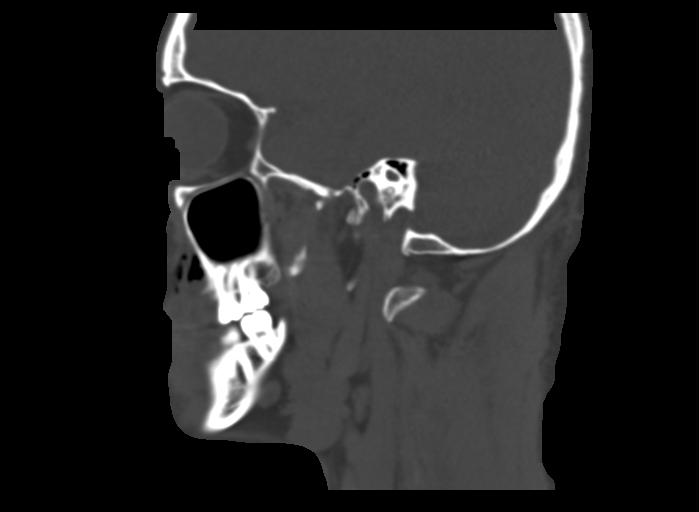
[im 48/96  bone]
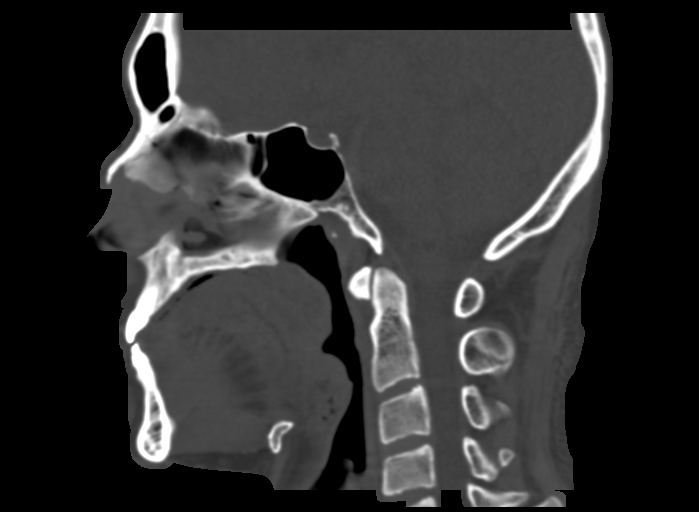
[im 64/96  bone]
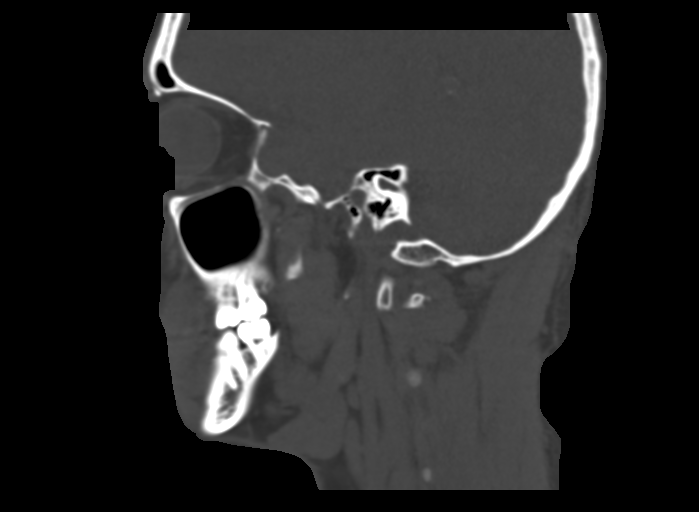

[15 of 47 positions shown; findings below may reference images not displayed]

FINDINGS: Osseous: Bilateral nasal bone fractures are present. There is
involvement of the frontal process of the maxilla on the left. There
is slight deviation to the right. Osseous nasal septal fracture is
noted as well.

The sinuses are intact. There are no fractures through the sinuses.
Zygomatic arch is normal bilaterally. The mandible is intact and
located.

Orbits: Globes and orbits are within normal limits.

Sinuses: Paranasal sinuses are clear. There is no fluid suggest
occult fracture.

Soft tissues: Subcutaneous gas spreads over the right maxilla
without associated fracture. This is likely related to the nasal
bone fractures. Soft tissue swelling is present about the nose.
Additional soft tissue swelling is present bilaterally. Perioral and
lip swelling is present on the left. No underlying fracture or
foreign body is present.

Limited intracranial: Within normal limits.
IMPRESSION: 1. Comminuted nasal bone fractures bilaterally are slightly
displaced to the right.
2. Anterior nasal septal fracture present.
3. No direct sinus fracture.
4. Diffuse soft tissue swelling over the face as described.
